# Patient Record
Sex: Female | Born: 1950
Health system: Southern US, Community
[De-identification: ages and names within clinical notes are randomized; demographics above are authoritative.]

## PROBLEM LIST (undated history)

## (undated) DIAGNOSIS — E119 Type 2 diabetes mellitus without complications: Secondary | ICD-10-CM

## (undated) DIAGNOSIS — I10 Essential (primary) hypertension: Secondary | ICD-10-CM

## (undated) HISTORY — PX: COLONOSCOPY: SHX174

---

## 1970-01-20 HISTORY — PX: CHOLECYSTECTOMY: SHX55

## 2011-01-21 HISTORY — PX: OTHER SURGICAL HISTORY: SHX169

## 2015-02-08 ENCOUNTER — Encounter (HOSPITAL_BASED_OUTPATIENT_CLINIC_OR_DEPARTMENT_OTHER): Payer: Self-pay | Admitting: *Deleted

## 2015-02-08 ENCOUNTER — Emergency Department (HOSPITAL_BASED_OUTPATIENT_CLINIC_OR_DEPARTMENT_OTHER)
Admission: EM | Admit: 2015-02-08 | Discharge: 2015-02-08 | Disposition: A | Discharge status: Home / Self Care | Payer: Worker's Compensation | Attending: Emergency Medicine | Admitting: Emergency Medicine

## 2015-02-08 ENCOUNTER — Emergency Department (HOSPITAL_BASED_OUTPATIENT_CLINIC_OR_DEPARTMENT_OTHER): Payer: Worker's Compensation

## 2015-02-08 DIAGNOSIS — S42251A Displaced fracture of greater tuberosity of right humerus, initial encounter for closed fracture: Secondary | ICD-10-CM | POA: Insufficient documentation

## 2015-02-08 DIAGNOSIS — W01198A Fall on same level from slipping, tripping and stumbling with subsequent striking against other object, initial encounter: Secondary | ICD-10-CM | POA: Insufficient documentation

## 2015-02-08 DIAGNOSIS — Y9389 Activity, other specified: Secondary | ICD-10-CM | POA: Insufficient documentation

## 2015-02-08 DIAGNOSIS — I10 Essential (primary) hypertension: Secondary | ICD-10-CM | POA: Diagnosis not present

## 2015-02-08 DIAGNOSIS — Z79899 Other long term (current) drug therapy: Secondary | ICD-10-CM | POA: Diagnosis not present

## 2015-02-08 DIAGNOSIS — Y998 Other external cause status: Secondary | ICD-10-CM | POA: Insufficient documentation

## 2015-02-08 DIAGNOSIS — S42309A Unspecified fracture of shaft of humerus, unspecified arm, initial encounter for closed fracture: Secondary | ICD-10-CM

## 2015-02-08 DIAGNOSIS — S42301A Unspecified fracture of shaft of humerus, right arm, initial encounter for closed fracture: Secondary | ICD-10-CM

## 2015-02-08 DIAGNOSIS — S4991XA Unspecified injury of right shoulder and upper arm, initial encounter: Secondary | ICD-10-CM | POA: Diagnosis present

## 2015-02-08 DIAGNOSIS — Y9289 Other specified places as the place of occurrence of the external cause: Secondary | ICD-10-CM | POA: Diagnosis not present

## 2015-02-08 DIAGNOSIS — E119 Type 2 diabetes mellitus without complications: Secondary | ICD-10-CM | POA: Insufficient documentation

## 2015-02-08 HISTORY — DX: Type 2 diabetes mellitus without complications: E11.9

## 2015-02-08 HISTORY — DX: Essential (primary) hypertension: I10

## 2015-02-08 MED ORDER — HYDROMORPHONE HCL 1 MG/ML IJ SOLN
1.0000 mg | Freq: Once | INTRAMUSCULAR | Status: AC
  Administered 2015-02-08: 1 mg via INTRAMUSCULAR
  Filled 2015-02-08: qty 1

## 2015-02-08 MED ORDER — OXYCODONE-ACETAMINOPHEN 5-325 MG PO TABS
1.0000 | ORAL_TABLET | Freq: Once | ORAL | Status: AC
  Administered 2015-02-08: 1 via ORAL
  Filled 2015-02-08: qty 1

## 2015-02-08 MED ORDER — OXYCODONE-ACETAMINOPHEN 5-325 MG PO TABS: 1.0000 | ORAL_TABLET | Freq: Four times a day (QID) | ORAL | Status: DC | PRN

## 2015-02-08 MED FILL — OXYCODONE/APAP 5-325: 5-325 | 7 days supply | Qty: 60 | Fill #0

## 2015-02-08 NOTE — ED Provider Notes (Signed)
CSN: [id number]     Arrival date & time 02/08/15  1018 History   First MD Initiated Contact with Patient 02/08/15 1036     Chief Complaint  Patient presents with  . Fall     (Consider location/radiation/quality/duration/timing/severity/associated sxs/prior Treatment) Patient is a 65 y.o. female presenting with fall.  Fall This is a new problem. The current episode started less than 1 hour ago. The problem has not changed since onset.Pertinent negatives include no chest pain. Nothing aggravates the symptoms. Nothing relieves the symptoms. She has tried nothing for the symptoms.    Past Medical History  Diagnosis Date  . Diabetes mellitus without complication (HCC)   . Hypertension    History reviewed. No pertinent past surgical history. History reviewed. No pertinent family history. Social History  Substance Use Topics  . Smoking status: Never Smoker   . Smokeless tobacco: None  . Alcohol Use: None   OB History    No data available     Review of Systems  Constitutional: Negative for chills and fatigue.  Respiratory: Negative for cough.   Cardiovascular: Negative for chest pain.  Musculoskeletal:       Pain in right anterior shoulder  All other systems reviewed and are negative.     Allergies  Review of patient's allergies indicates no known allergies.  Home Medications   Prior to Admission medications   Medication Sig Start Date End Date Taking? Authorizing Provider  candesartan-hydrochlorothiazide (ATACAND HCT) 32-12.5 MG tablet Take 1 tablet by mouth daily.   Yes Historical Provider, MD  esomeprazole (NEXIUM) 20 MG capsule Take 20 mg by mouth daily at 12 noon.   Yes Historical Provider, MD  pioglitazone (ACTOS) 45 MG tablet Take 45 mg by mouth daily.   Yes Historical Provider, MD  oxyCODONE-acetaminophen (PERCOCET/ROXICET) 5-325 MG tablet Take 1-2 tablets by mouth every 6 (six) hours as needed for severe pain. 02/08/15   Barbara Cower Kaylem Gidney, MD   BP 122/59 mmHg  Pulse  80  Temp(Src) 98.4 F (36.9 C) (Oral)  Resp 16  Ht  (1.727 m)  Wt 235 lb (106.595 kg)  BMI 35.74 kg/m2  SpO2 98% Physical Exam  Constitutional: She is oriented to person, place, and time. She appears well-developed and well-nourished.  HENT:  Head: Normocephalic and atraumatic.  Neck: Normal range of motion.  Cardiovascular: Normal rate and regular rhythm.   Pulmonary/Chest: No stridor. No respiratory distress.  Abdominal: Soft. She exhibits no distension. There is no tenderness.  Musculoskeletal: She exhibits tenderness (palpation of her anterior proximal humerus.).  Neurological: She is alert and oriented to person, place, and time.  Skin: Skin is warm and dry. No rash noted. No erythema.  Nursing note and vitals reviewed.   ED Course  Procedures (including critical care time) Labs Review Labs Reviewed - No data to display  Imaging Review Dg Shoulder Right  02/08/2015  CLINICAL DATA:  Fall on the floor at work this morning, right shoulder pain EXAM: RIGHT SHOULDER - 2+ VIEW COMPARISON:  None. FINDINGS: Two views of the right shoulder submitted. There is comminuted mild displaced fracture of the right humeral neck. Mild lateral displacement of greater humeral tuberosity fragment. IMPRESSION: Comminuted mild displaced fracture of the right proximal humerus. Electronically Signed   By: Natasha Mead M.D.   On: 02/08/2015 11:05   Ct Shoulder Right Wo Contrast  02/08/2015  CLINICAL DATA:  Shoulder pain after falling at work today. Proximal humeral fracture. EXAM: CT OF THE RIGHT SHOULDER WITHOUT CONTRAST TECHNIQUE:  Multidetector CT imaging was performed according to the standard protocol. Multiplanar CT image reconstructions were also generated. COMPARISON:  Radiographs 02/08/2015. FINDINGS: Comminuted fracture of the right humeral neck has 3 main components. The fracture involves the greater tuberosity which is mildly displaced. There is no involvement of the humeral head articular  surface. The humeral head is located. There is no evidence of glenoid or other scapular fracture. The acromioclavicular joint appears normal. There is some soft tissue swelling around the shoulder with a small shoulder joint effusion. The rotator cuff musculature appears normal. There is no significant narrowing of the subacromial space. IMPRESSION: 1. Comminuted fracture of the right humeral neck with displacement of the greater tuberosity. 2. No involvement of the humeral head articular surface, dislocation or glenoid fracture. Electronically Signed   By: Carey Bullocks M.D.   On: 02/08/2015 12:34   I have personally reviewed and evaluated these images and lab results as part of my medical decision-making.   EKG Interpretation None      MDM   Final diagnoses:  Humerus fracture   Mechanical fall onto her knees and outstretched hands. Subsequently has a right shoulder pain with difficulty moving his secondary to pain. Has tenderness near her before meals joint. We'll start with x-rays and by mouth pain medication. Found to have comminuted right humeral neck fracture. D/W Dr. Lequita Halt with orthopedics who suggested ct scan for surgical planning and close follow up in the office with a sling immobilizer. Pain controlled, NVI at time of discharge. Return precautions given.     Marily Memos, MD 02/08/15 [id number]

## 2015-02-08 NOTE — ED Notes (Signed)
MD at bedside. 

## 2015-02-08 NOTE — ED Notes (Signed)
Pt to room 9 in w/c, reports trip and fall hitting her right shoulder on the floor. Denies head injury or loc, states she also hit both knees on the floor. Dr. Clayborne Dana at bedside for exam.

## 2015-02-09 ENCOUNTER — Encounter (HOSPITAL_COMMUNITY): Payer: Self-pay | Admitting: *Deleted

## 2015-02-09 ENCOUNTER — Other Ambulatory Visit (HOSPITAL_COMMUNITY): Payer: Self-pay | Admitting: Orthopaedic Surgery

## 2015-02-11 MED ORDER — CEFAZOLIN SODIUM-DEXTROSE 2-3 GM-% IV SOLR: 2.0000 g | INTRAVENOUS | Status: DC

## 2015-02-11 MED ORDER — CHLORHEXIDINE GLUCONATE 4 % EX LIQD: 60.0000 mL | Freq: Once | CUTANEOUS | Status: DC

## 2015-02-12 ENCOUNTER — Encounter (HOSPITAL_COMMUNITY): Payer: Self-pay | Admitting: *Deleted

## 2015-02-12 ENCOUNTER — Inpatient Hospital Stay (HOSPITAL_COMMUNITY): Payer: Worker's Compensation | Admitting: Anesthesiology

## 2015-02-12 ENCOUNTER — Inpatient Hospital Stay (HOSPITAL_COMMUNITY): Payer: Worker's Compensation

## 2015-02-12 ENCOUNTER — Observation Stay (HOSPITAL_COMMUNITY)
Admission: RE | Admit: 2015-02-12 | Discharge: 2015-02-13 | Disposition: A | Discharge status: Home / Self Care | Payer: Worker's Compensation | Source: Ambulatory Visit | Attending: Orthopaedic Surgery | Admitting: Orthopaedic Surgery

## 2015-02-12 ENCOUNTER — Encounter (HOSPITAL_COMMUNITY)
Admission: RE | Disposition: A | Discharge status: Home / Self Care | Payer: Self-pay | Source: Ambulatory Visit | Attending: Orthopaedic Surgery

## 2015-02-12 DIAGNOSIS — S42209A Unspecified fracture of upper end of unspecified humerus, initial encounter for closed fracture: Secondary | ICD-10-CM | POA: Diagnosis present

## 2015-02-12 DIAGNOSIS — I10 Essential (primary) hypertension: Secondary | ICD-10-CM | POA: Diagnosis not present

## 2015-02-12 DIAGNOSIS — W19XXXA Unspecified fall, initial encounter: Secondary | ICD-10-CM | POA: Diagnosis not present

## 2015-02-12 DIAGNOSIS — Z419 Encounter for procedure for purposes other than remedying health state, unspecified: Secondary | ICD-10-CM

## 2015-02-12 DIAGNOSIS — S42201A Unspecified fracture of upper end of right humerus, initial encounter for closed fracture: Secondary | ICD-10-CM | POA: Diagnosis present

## 2015-02-12 DIAGNOSIS — Z7984 Long term (current) use of oral hypoglycemic drugs: Secondary | ICD-10-CM | POA: Insufficient documentation

## 2015-02-12 DIAGNOSIS — E119 Type 2 diabetes mellitus without complications: Secondary | ICD-10-CM | POA: Diagnosis not present

## 2015-02-12 HISTORY — PX: ORIF HUMERUS FRACTURE: SHX2126

## 2015-02-12 LAB — PROTIME-INR
INR: 1.18 (ref 0.00–1.49)
Prothrombin Time: 15.1 seconds (ref 11.6–15.2)

## 2015-02-12 LAB — CBC
HCT: 31.2 % — ABNORMAL LOW (ref 36.0–46.0)
Hemoglobin: 9.9 g/dL — ABNORMAL LOW (ref 12.0–15.0)
MCH: 27 pg (ref 26.0–34.0)
MCHC: 31.7 g/dL (ref 30.0–36.0)
MCV: 85.2 fL (ref 78.0–100.0)
Platelets: 312 10*3/uL (ref 150–400)
RBC: 3.66 MIL/uL — ABNORMAL LOW (ref 3.87–5.11)
RDW: 16.1 % — AB (ref 11.5–15.5)
WBC: 8.2 10*3/uL (ref 4.0–10.5)

## 2015-02-12 LAB — COMPREHENSIVE METABOLIC PANEL
ALBUMIN: 3.4 g/dL — AB (ref 3.5–5.0)
ALT: 11 U/L — ABNORMAL LOW (ref 14–54)
ANION GAP: 10 (ref 5–15)
AST: 19 U/L (ref 15–41)
Alkaline Phosphatase: 101 U/L (ref 38–126)
BUN: 17 mg/dL (ref 6–20)
CO2: 25 mmol/L (ref 22–32)
Calcium: 9.4 mg/dL (ref 8.9–10.3)
Chloride: 101 mmol/L (ref 101–111)
Creatinine, Ser: 0.79 mg/dL (ref 0.44–1.00)
GFR calc non Af Amer: >60 mL/min (ref >60)
GLUCOSE: 133 mg/dL — AB (ref 65–99)
POTASSIUM: 4.7 mmol/L (ref 3.5–5.1)
SODIUM: 136 mmol/L (ref 135–145)
TOTAL PROTEIN: 6.8 g/dL (ref 6.5–8.1)
Total Bilirubin: 1.1 mg/dL (ref 0.3–1.2)

## 2015-02-12 LAB — GLUCOSE, CAPILLARY
GLUCOSE-CAPILLARY: 115 mg/dL — AB (ref 65–99)
GLUCOSE-CAPILLARY: 110 mg/dL — AB (ref 65–99)

## 2015-02-12 SURGERY — OPEN REDUCTION INTERNAL FIXATION (ORIF) PROXIMAL HUMERUS FRACTURE
Anesthesia: General | Laterality: Right

## 2015-02-12 MED ORDER — ROCURONIUM BROMIDE 100 MG/10ML IV SOLN
INTRAVENOUS | Status: DC | PRN
  Administered 2015-02-12: 40 mg via INTRAVENOUS
  Administered 2015-02-12: 10 mg via INTRAVENOUS

## 2015-02-12 MED ORDER — MIDAZOLAM HCL 2 MG/2ML IJ SOLN
INTRAMUSCULAR | Status: AC
  Filled 2015-02-12: qty 2

## 2015-02-12 MED ORDER — METHOCARBAMOL 500 MG PO TABS
ORAL_TABLET | ORAL | Status: AC
  Administered 2015-02-12: 500 mg via ORAL
  Filled 2015-02-12: qty 1

## 2015-02-12 MED ORDER — METHOCARBAMOL 1000 MG/10ML IJ SOLN
500.0000 mg | Freq: Four times a day (QID) | INTRAVENOUS | Status: DC | PRN
  Filled 2015-02-12: qty 5

## 2015-02-12 MED ORDER — PROPOFOL 10 MG/ML IV BOLUS
INTRAVENOUS | Status: DC | PRN
Start: 2015-02-12 — End: 2015-02-12
  Administered 2015-02-12: 150 mg via INTRAVENOUS

## 2015-02-12 MED ORDER — OXYCODONE HCL 5 MG PO TABS
ORAL_TABLET | ORAL | Status: AC
  Administered 2015-02-12: 10 mg via ORAL
  Filled 2015-02-12: qty 2

## 2015-02-12 MED ORDER — PANTOPRAZOLE SODIUM 40 MG PO TBEC
40.0000 mg | DELAYED_RELEASE_TABLET | Freq: Every day | ORAL | Status: DC
  Administered 2015-02-13: 40 mg via ORAL
  Filled 2015-02-12: qty 1

## 2015-02-12 MED ORDER — ACETAMINOPHEN 325 MG PO TABS: 650.0000 mg | ORAL_TABLET | Freq: Four times a day (QID) | ORAL | Status: DC | PRN

## 2015-02-12 MED ORDER — METHOCARBAMOL 500 MG PO TABS: 500.0000 mg | ORAL_TABLET | Freq: Four times a day (QID) | ORAL | Status: AC | PRN

## 2015-02-12 MED ORDER — FENTANYL CITRATE (PF) 100 MCG/2ML IJ SOLN: 25.0000 ug | INTRAMUSCULAR | Status: DC | PRN

## 2015-02-12 MED ORDER — GLYCOPYRROLATE 0.2 MG/ML IJ SOLN
INTRAMUSCULAR | Status: DC | PRN
  Administered 2015-02-12: 0.2 mg via INTRAVENOUS

## 2015-02-12 MED ORDER — EPHEDRINE SULFATE 50 MG/ML IJ SOLN
INTRAMUSCULAR | Status: DC | PRN
  Administered 2015-02-12: 5 mg via INTRAVENOUS

## 2015-02-12 MED ORDER — FENTANYL CITRATE (PF) 100 MCG/2ML IJ SOLN
INTRAMUSCULAR | Status: DC | PRN
  Administered 2015-02-12: 50 ug via INTRAVENOUS
  Administered 2015-02-12: 100 ug via INTRAVENOUS

## 2015-02-12 MED ORDER — METHOCARBAMOL 500 MG PO TABS
500.0000 mg | ORAL_TABLET | Freq: Four times a day (QID) | ORAL | Status: DC | PRN
  Administered 2015-02-12 – 2015-02-13 (×2): 500 mg via ORAL
  Filled 2015-02-12: qty 1

## 2015-02-12 MED ORDER — HYDROCHLOROTHIAZIDE 12.5 MG PO CAPS
12.5000 mg | ORAL_CAPSULE | Freq: Every day | ORAL | Status: DC
  Administered 2015-02-13: 12.5 mg via ORAL
  Filled 2015-02-12: qty 1

## 2015-02-12 MED ORDER — ONDANSETRON HCL 4 MG/2ML IJ SOLN: 4.0000 mg | Freq: Once | INTRAMUSCULAR | Status: DC | PRN

## 2015-02-12 MED ORDER — CEFAZOLIN SODIUM-DEXTROSE 2-3 GM-% IV SOLR
INTRAVENOUS | Status: AC
  Administered 2015-02-12: 2 g via INTRAVENOUS
  Filled 2015-02-12: qty 50

## 2015-02-12 MED ORDER — OXYCODONE HCL 5 MG PO TABS
5.0000 mg | ORAL_TABLET | ORAL | Status: DC | PRN
  Administered 2015-02-12 – 2015-02-13 (×5): 10 mg via ORAL
  Filled 2015-02-12 (×4): qty 2

## 2015-02-12 MED ORDER — CEFAZOLIN SODIUM 1-5 GM-% IV SOLN
1.0000 g | Freq: Three times a day (TID) | INTRAVENOUS | Status: AC
  Administered 2015-02-12 – 2015-02-13 (×2): 1 g via INTRAVENOUS
  Filled 2015-02-12 (×2): qty 50

## 2015-02-12 MED ORDER — ONDANSETRON HCL 4 MG PO TABS: 4.0000 mg | ORAL_TABLET | Freq: Four times a day (QID) | ORAL | Status: DC | PRN

## 2015-02-12 MED ORDER — LIDOCAINE HCL (CARDIAC) 20 MG/ML IV SOLN
INTRAVENOUS | Status: DC | PRN
  Administered 2015-02-12: 40 mg via INTRAVENOUS

## 2015-02-12 MED ORDER — METOCLOPRAMIDE HCL 5 MG PO TABS: 5.0000 mg | ORAL_TABLET | Freq: Three times a day (TID) | ORAL | Status: DC | PRN

## 2015-02-12 MED ORDER — OXYCODONE-ACETAMINOPHEN 5-325 MG PO TABS: 1.0000 | ORAL_TABLET | Freq: Four times a day (QID) | ORAL | Status: AC | PRN

## 2015-02-12 MED ORDER — MIDAZOLAM HCL 2 MG/2ML IJ SOLN
INTRAMUSCULAR | Status: AC
  Administered 2015-02-12: 2 mg
  Filled 2015-02-12: qty 2

## 2015-02-12 MED ORDER — METOCLOPRAMIDE HCL 5 MG/ML IJ SOLN: 5.0000 mg | Freq: Three times a day (TID) | INTRAMUSCULAR | Status: DC | PRN

## 2015-02-12 MED ORDER — FENTANYL CITRATE (PF) 100 MCG/2ML IJ SOLN
50.0000 ug | Freq: Once | INTRAMUSCULAR | Status: AC
  Administered 2015-02-12: 50 ug via INTRAVENOUS

## 2015-02-12 MED ORDER — POTASSIUM CHLORIDE IN NACL 20-0.45 MEQ/L-% IV SOLN
INTRAVENOUS | Status: DC
  Administered 2015-02-13: 01:00:00 via INTRAVENOUS
  Filled 2015-02-12 (×2): qty 1000

## 2015-02-12 MED ORDER — ONDANSETRON HCL 4 MG/2ML IJ SOLN
INTRAMUSCULAR | Status: DC | PRN
  Administered 2015-02-12: 4 mg via INTRAVENOUS

## 2015-02-12 MED ORDER — MIDAZOLAM HCL 5 MG/ML IJ SOLN: 2.0000 mg | Freq: Once | INTRAMUSCULAR | Status: DC

## 2015-02-12 MED ORDER — SALINE SPRAY 0.65 % NA SOLN: 1.0000 | NASAL | Status: DC | PRN

## 2015-02-12 MED ORDER — FENTANYL CITRATE (PF) 250 MCG/5ML IJ SOLN
INTRAMUSCULAR | Status: AC
  Filled 2015-02-12: qty 5

## 2015-02-12 MED ORDER — CANDESARTAN CILEXETIL-HCTZ 32-12.5 MG PO TABS: 1.0000 | ORAL_TABLET | Freq: Every day | ORAL | Status: DC

## 2015-02-12 MED ORDER — PROPOFOL 10 MG/ML IV BOLUS
INTRAVENOUS | Status: AC
  Filled 2015-02-12: qty 20

## 2015-02-12 MED ORDER — LACTATED RINGERS IV SOLN
INTRAVENOUS | Status: DC
  Administered 2015-02-12 (×4): via INTRAVENOUS

## 2015-02-12 MED ORDER — SUGAMMADEX SODIUM 200 MG/2ML IV SOLN
INTRAVENOUS | Status: DC | PRN
  Administered 2015-02-12: 210 mg via INTRAVENOUS

## 2015-02-12 MED ORDER — MENTHOL 3 MG MT LOZG: 1.0000 | LOZENGE | OROMUCOSAL | Status: DC | PRN

## 2015-02-12 MED ORDER — PHENOL 1.4 % MT LIQD
1.0000 | OROMUCOSAL | Status: DC | PRN
Start: 2015-02-12 — End: 2015-02-13

## 2015-02-12 MED ORDER — ACETAMINOPHEN 650 MG RE SUPP: 650.0000 mg | Freq: Four times a day (QID) | RECTAL | Status: DC | PRN

## 2015-02-12 MED ORDER — HYDROMORPHONE HCL 1 MG/ML IJ SOLN: 0.5000 mg | INTRAMUSCULAR | Status: DC | PRN

## 2015-02-12 MED ORDER — SUGAMMADEX SODIUM 500 MG/5ML IV SOLN
INTRAVENOUS | Status: AC
  Filled 2015-02-12: qty 5

## 2015-02-12 MED ORDER — IRBESARTAN 300 MG PO TABS
300.0000 mg | ORAL_TABLET | Freq: Every day | ORAL | Status: DC
  Administered 2015-02-13: 300 mg via ORAL
  Filled 2015-02-12: qty 1

## 2015-02-12 MED ORDER — SODIUM CHLORIDE 0.9 % IR SOLN
Status: DC | PRN
  Administered 2015-02-12: 1000 mL

## 2015-02-12 MED ORDER — PHENYLEPHRINE HCL 10 MG/ML IJ SOLN
INTRAMUSCULAR | Status: DC | PRN
  Administered 2015-02-12: 40 ug via INTRAVENOUS

## 2015-02-12 MED ORDER — POLYETHYLENE GLYCOL 3350 17 G PO PACK: 17.0000 g | PACK | Freq: Every day | ORAL | Status: DC | PRN

## 2015-02-12 MED ORDER — PIOGLITAZONE HCL 45 MG PO TABS
45.0000 mg | ORAL_TABLET | Freq: Every day | ORAL | Status: DC
  Administered 2015-02-13: 45 mg via ORAL
  Filled 2015-02-12 (×3): qty 1

## 2015-02-12 MED ORDER — ONDANSETRON HCL 4 MG/2ML IJ SOLN: 4.0000 mg | Freq: Four times a day (QID) | INTRAMUSCULAR | Status: DC | PRN

## 2015-02-12 MED ORDER — PHENYLEPHRINE HCL 10 MG/ML IJ SOLN
10.0000 mg | INTRAVENOUS | Status: DC | PRN
  Administered 2015-02-12: 15 ug/min via INTRAVENOUS

## 2015-02-12 MED ORDER — DOCUSATE SODIUM 100 MG PO CAPS
100.0000 mg | ORAL_CAPSULE | Freq: Two times a day (BID) | ORAL | Status: DC
Start: 2015-02-13 — End: 2015-02-13
  Administered 2015-02-13: 100 mg via ORAL
  Filled 2015-02-12: qty 1

## 2015-02-12 MED ORDER — FENTANYL CITRATE (PF) 100 MCG/2ML IJ SOLN
INTRAMUSCULAR | Status: AC
  Administered 2015-02-12: 50 ug via INTRAVENOUS
  Filled 2015-02-12: qty 2

## 2015-02-12 SURGICAL SUPPLY — 59 items
BENZOIN TINCTURE PRP APPL 2/3 (GAUZE/BANDAGES/DRESSINGS) IMPLANT
BIT DRILL 3.2 (BIT) ×2
BIT DRILL 3.2XCALB NS DISP (BIT) ×1 IMPLANT
BIT DRILL CALIBRATED 2.7 (BIT) ×2 IMPLANT
BIT DRILL CALIBRATED 2.7MM (BIT) ×1
BIT DRL 3.2XCALB NS DISP (BIT) ×1
CLOSURE WOUND 1/2 X4 (GAUZE/BANDAGES/DRESSINGS) ×2
COVER SURGICAL LIGHT HANDLE (MISCELLANEOUS) ×3 IMPLANT
DRAPE C-ARM 42X72 X-RAY (DRAPES) ×3 IMPLANT
DRAPE IMP U-DRAPE 54X76 (DRAPES) ×3 IMPLANT
DRAPE INCISE IOBAN 66X45 STRL (DRAPES) ×3 IMPLANT
DRAPE SURG 17X23 STRL (DRAPES) ×3 IMPLANT
DRAPE U-SHAPE 47X51 STRL (DRAPES) ×3 IMPLANT
DRSG EMULSION OIL 3X3 NADH (GAUZE/BANDAGES/DRESSINGS) ×3 IMPLANT
DRSG MEPILEX BORDER 4X12 (GAUZE/BANDAGES/DRESSINGS) ×3 IMPLANT
ELECT REM PT RETURN 9FT ADLT (ELECTROSURGICAL) ×3
ELECTRODE REM PT RTRN 9FT ADLT (ELECTROSURGICAL) ×1 IMPLANT
GAUZE SPONGE 4X4 12PLY STRL (GAUZE/BANDAGES/DRESSINGS) ×3 IMPLANT
GLOVE BIOGEL PI IND STRL 8 (GLOVE) ×2 IMPLANT
GLOVE BIOGEL PI INDICATOR 8 (GLOVE) ×4
GLOVE ORTHO TXT STRL SZ7.5 (GLOVE) ×6 IMPLANT
GOWN STRL REUS W/ TWL LRG LVL3 (GOWN DISPOSABLE) ×1 IMPLANT
GOWN STRL REUS W/ TWL XL LVL3 (GOWN DISPOSABLE) ×1 IMPLANT
GOWN STRL REUS W/TWL 2XL LVL3 (GOWN DISPOSABLE) ×3 IMPLANT
GOWN STRL REUS W/TWL LRG LVL3 (GOWN DISPOSABLE) ×2
GOWN STRL REUS W/TWL XL LVL3 (GOWN DISPOSABLE) ×2
K-WIRE 2X5 SS THRDED S3 (WIRE) ×6
KIT BASIN OR (CUSTOM PROCEDURE TRAY) ×3 IMPLANT
KIT ROOM TURNOVER OR (KITS) ×3 IMPLANT
KWIRE 2X5 SS THRDED S3 (WIRE) ×2 IMPLANT
MANIFOLD NEPTUNE II (INSTRUMENTS) ×3 IMPLANT
NEEDLE HYPO 25GX1X1/2 BEV (NEEDLE) IMPLANT
NS IRRIG 1000ML POUR BTL (IV SOLUTION) ×3 IMPLANT
PACK SHOULDER (CUSTOM PROCEDURE TRAY) ×3 IMPLANT
PACK UNIVERSAL I (CUSTOM PROCEDURE TRAY) ×3 IMPLANT
PAD ARMBOARD 7.5X6 YLW CONV (MISCELLANEOUS) ×6 IMPLANT
PEG LOCKING 3.2MMX26MM (Peg) ×3 IMPLANT
PEG LOCKING 3.2MMX44 (Peg) ×3 IMPLANT
PEG LOCKING 3.2MMX46 (Peg) ×3 IMPLANT
PEG LOCKING 3.2X32 (Peg) ×3 IMPLANT
PEG LOCKING 3.2X36 (Screw) ×3 IMPLANT
PEG LOCKING 3.2X38 (Screw) ×6 IMPLANT
PEG LOCKING 3.2X42 (Screw) ×6 IMPLANT
PEG LOCKING 3.2X48 (Peg) ×3 IMPLANT
PLATE PROX HUM HI R 3H 80 (Plate) ×3 IMPLANT
SCREW LP NL T15 3.5X24 (Screw) ×9 IMPLANT
SCREW LP NL T15 3.5X26 (Screw) ×3 IMPLANT
SLEEVE MEASURING 3.2 (BIT) ×3 IMPLANT
SLING ARM IMMOBILIZER XL (CAST SUPPLIES) ×3 IMPLANT
SPONGE LAP 18X18 X RAY DECT (DISPOSABLE) ×6 IMPLANT
STRIP CLOSURE SKIN 1/2X4 (GAUZE/BANDAGES/DRESSINGS) ×4 IMPLANT
SUCTION FRAZIER TIP 10 FR DISP (SUCTIONS) ×3 IMPLANT
SUT FIBERWIRE #2 38 T-5 BLUE (SUTURE)
SUT VIC AB 2-0 CT1 27 (SUTURE) ×2
SUT VIC AB 2-0 CT1 TAPERPNT 27 (SUTURE) ×1 IMPLANT
SUT VIC AB 3-0 FS2 27 (SUTURE) ×3 IMPLANT
SUTURE FIBERWR #2 38 T-5 BLUE (SUTURE) IMPLANT
SYR CONTROL 10ML LL (SYRINGE) IMPLANT
WATER STERILE IRR 1000ML POUR (IV SOLUTION) IMPLANT

## 2015-02-12 NOTE — Brief Op Note (Signed)
02/12/2015  3:03 PM  PATIENT:  Megan Williamson  65 y.o. female  PRE-OPERATIVE DIAGNOSIS:  Right Proximal Humerus Fracture  POST-OPERATIVE DIAGNOSIS:  SAME  PROCEDURE:  Procedure(s): OPEN REDUCTION INTERNAL FIXATION (ORIF) PROXIMAL HUMERUS FRACTURE (Right)  SURGEON:  Surgeon(s) and Role:    * Eldred Manges, MD - Primary  PHYSICIAN ASSISTANT: Krysteena Stalker m. Barry Dienes     ANESTHESIA:   general  EBL:  Total I/O In: 1100 [I.V.:1100] Out: 100 [Blood:100]  BLOOD ADMINISTERED:none  DRAINS: none   LOCAL MEDICATIONS USED:  NONE  SPECIMEN:  No Specimen  DISPOSITION OF SPECIMEN:  N/A  COUNTS:  YES  TOURNIQUET:  * No tourniquets in log *  DICTATION: .Dragon Dictation  PLAN OF CARE: Admit for overnight observation  PATIENT DISPOSITION:  PACU - hemodynamically stable.

## 2015-02-12 NOTE — Anesthesia Procedure Notes (Addendum)
Anesthesia Regional Block:  Interscalene brachial plexus block  Pre-Anesthetic Checklist: ,, timeout performed, Correct Patient, Correct Site, Correct Laterality, Correct Procedure, Correct Position, site marked, Risks and benefits discussed,  Surgical consent,  Pre-op evaluation,  At surgeon's request and post-op pain management  Laterality: Right  Prep: chloraprep       Needles:  Injection technique: Single-shot  Needle Type: Echogenic Stimulator Needle      Needle Gauge: 21 and 21 G    Additional Needles:  Procedures: ultrasound guided (picture in chart) and nerve stimulator Interscalene brachial plexus block Narrative:  Injection made incrementally with aspirations every 5 mL.  Performed by: Personally  Anesthesiologist: JUDD, MARY  Additional Notes: Risks, benefits and alternative to block explained extensively.  Patient tolerated procedure well, without complications.   Procedure Name: Intubation Performed by: Dairl Ponder Pre-anesthesia Checklist: Patient identified, Emergency Drugs available, Suction available, Patient being monitored and Timeout performed Patient Re-evaluated:Patient Re-evaluated prior to inductionOxygen Delivery Method: Circle system utilized Preoxygenation: Pre-oxygenation with 100% oxygen Intubation Type: IV induction Ventilation: Mask ventilation without difficulty and Oral airway inserted - appropriate to patient size Laryngoscope Size: Mac and 3 Grade View: Grade I Tube type: Oral Tube size: 7.0 mm Number of attempts: 1 Placement Confirmation: ETT inserted through vocal cords under direct vision,  positive ETCO2,  CO2 detector and breath sounds checked- equal and bilateral Secured at: 21 cm Tube secured with: Tape

## 2015-02-12 NOTE — Transfer of Care (Signed)
Immediate Anesthesia Transfer of Care Note  Patient: Megan Williamson  Procedure(s) Performed: Procedure(s): OPEN REDUCTION INTERNAL FIXATION (ORIF) PROXIMAL HUMERUS FRACTURE (Right)  Patient Location: PACU  Anesthesia Type:General  Level of Consciousness: awake, alert  and oriented  Airway & Oxygen Therapy: Patient Spontanous Breathing and Patient connected to nasal cannula oxygen  Post-op Assessment: Report given to RN and Post -op Vital signs reviewed and stable  Post vital signs: Reviewed and stable  Last Vitals:  Filed Vitals:   02/12/15 1150 02/12/15 1155  BP: 127/44 98/85  Pulse: 83 88  Temp:    Resp: 21 19    Complications: No apparent anesthesia complications

## 2015-02-12 NOTE — Anesthesia Preprocedure Evaluation (Addendum)
Anesthesia Evaluation  Patient identified by MRN, date of birth, ID band Patient awake    Reviewed: Allergy & Precautions, NPO status , Patient's Chart, lab work & pertinent test results  History of Anesthesia Complications Negative for: history of anesthetic complications  Airway Mallampati: II  TM Distance: >3 FB Neck ROM: Full    Dental no notable dental hx. (+) Dental Advisory Given   Pulmonary neg pulmonary ROS,    Pulmonary exam normal breath sounds clear to auscultation       Cardiovascular hypertension, Pt. on medications Normal cardiovascular exam Rhythm:Regular Rate:Normal     Neuro/Psych negative neurological ROS  negative psych ROS   GI/Hepatic negative GI ROS, Neg liver ROS,   Endo/Other  diabetes, Oral Hypoglycemic Agents  Renal/GU negative Renal ROS  negative genitourinary   Musculoskeletal negative musculoskeletal ROS (+)   Abdominal   Peds negative pediatric ROS (+)  Hematology negative hematology ROS (+)   Anesthesia Other Findings   Reproductive/Obstetrics negative OB ROS                            Anesthesia Physical Anesthesia Plan  ASA: II  Anesthesia Plan: General   Post-op Pain Management: GA combined w/ Regional for post-op pain   Induction: Intravenous  Airway Management Planned: Oral ETT  Additional Equipment:   Intra-op Plan:   Post-operative Plan: Extubation in OR  Informed Consent: I have reviewed the patients History and Physical, chart, labs and discussed the procedure including the risks, benefits and alternatives for the proposed anesthesia with the patient or authorized representative who has indicated his/her understanding and acceptance.   Dental advisory given  Plan Discussed with: CRNA  Anesthesia Plan Comments:         Anesthesia Quick Evaluation

## 2015-02-12 NOTE — Interval H&P Note (Signed)
History and Physical Interval Note:  02/12/2015 12:29 PM  Megan Williamson  has presented today for surgery, with the diagnosis of Right Proximal Humerus Fracture  The various methods of treatment have been discussed with the patient and family. After consideration of risks, benefits and other options for treatment, the patient has consented to  Procedure(s): OPEN REDUCTION INTERNAL FIXATION (ORIF) PROXIMAL HUMERUS FRACTURE (Right) as a surgical intervention .  The patient's history has been reviewed, patient examined, no change in status, stable for surgery.  I have reviewed the patient's chart and labs.  Questions were answered to the patient's satisfaction.     YATES,MARK C

## 2015-02-12 NOTE — Op Note (Signed)
Megan Williamson, DAPONTE NO.:  [id number]  MEDICAL RECORD NO.:  [id number]  LOCATION:  [id number]                        FACILITY:  [facility]  PHYSICIAN:  Daymian Lill C. Ophelia Charter, M.D.    DATE OF BIRTH:  [birth year]  DATE OF PROCEDURE:  02/12/2015 DATE OF DISCHARGE:                              OPERATIVE REPORT   PREOPERATIVE DIAGNOSIS:  Comminuted right proximal humerus fracture.  POSTOPERATIVE DIAGNOSIS:  Comminuted right proximal humerus fracture.  PROCEDURE:  Open reduction and internal fixation of right proximal humerus.  SURGEON:  Saburo Luger C. Ophelia Charter, M.D.  ASSISTANT:  Genene Churn. Barry Dienes, PA-C, medically necessary and present for the entire procedure.  ANESTHESIA:  General plus preoperative scalene block.  ESTIMATED BLOOD LOSS:  Less than 100 mL.  IMPLANTS:  Biomet anatomic proximal humerus plate with distal screws and multiple smooth pegs.  DESCRIPTION OF PROCEDURE:  After induction of general anesthesia, orotracheal intubation, standard prepping and draping, preoperative block, the patient was in the beach chair position.  Ancef was given prophylactically.  Deltopectoral incision was made.  After time-out, Betadine Steri-Drape had been applied, impervious stockinette and Coban. Cephalic vein was taken laterally with the arm.  A narrow large Hohmann retractor was placed.  Biceps tendon was identified and the greater tuberosity fragment actually extended around to the bicipital groove and large shoulder retractor was able to be hooked over the greater tuberosity and pushed it down into place.  The tall Biomet plate, short length was selected so would catch the tuberosity fragment.  It was adjusted, checked and then a K-wire was drilled.  The plate was just slightly high.  Slotted hole was drilled and then plate was slid down a few millimeters, 2 to 3 and then re-pinned, rechecked and was in excellent position.  Distal holes were filled, initial screw to suck the plate down,  still had it slightly off the bone.  Additional nonlocking screws were used to pull the plate marching from the bottom proximal. Next, proximal peg holes were drilled.  All holes were drilled, checked, depth gauge measurements and awls were tightened down securely.  The shoulder was rotated, checked on fluoroscopy, AP, lateral and then flexed and extended and rotated to make sure pegs would approach the joint, none penetrated through the articular surface.  There was good position and alignment.  All screw holes were filled with either screws or the proximal areas with the pegs.  Copious irrigation, closure of the deltopectoral lying and fall back together too on the subcutaneous tissue, subcuticular skin closure, postop dressing, and then replacement of a sling.  The patient tolerated the procedure well, transferred to the recovery room in stable condition.     Chrystina Naff C. Ophelia Charter, M.D.     MCY/MEDQ  D:  02/12/2015  T:  02/12/2015  Job:  [id number]

## 2015-02-12 NOTE — H&P (Signed)
Megan Williamson is an 65 y.o. female.   Chief complaint: right proximal humerus fracture  HPI:  Patient fell last week landing onto her right shoulder.  xrays showed a right proximal humerus fracture.  Seen in our office to discuss definitive treatment options.    Past Medical History  Diagnosis Date  . Hypertension   . Diabetes mellitus without complication (HCC)     Type II    Past Surgical History  Procedure Laterality Date  . Fracture arm Left 2013  . Cesarean section  1988  . Cholecystectomy  1972  . Colonoscopy      History reviewed. No pertinent family history. Social History:  reports that she has never smoked. She does not have any smokeless tobacco history on file. She reports that she does not drink alcohol or use illicit drugs.  Allergies: No Known Allergies  No prescriptions prior to admission    No results found for this or any previous visit (from the past 48 hour(s)). No results found.  Review of Systems  Constitutional: Negative.   HENT: Negative.   Eyes: Negative.   Respiratory: Negative.   Cardiovascular: Negative.   Gastrointestinal: Negative.   Genitourinary: Negative.   Musculoskeletal: Positive for joint pain.  Skin: Negative.   Neurological: Negative.   Psychiatric/Behavioral: Negative.     There were no vitals taken for this visit. Physical Exam  Constitutional: She is oriented to person, place, and time. No distress.  HENT:  Head: Atraumatic.  Eyes: EOM are normal.  Neck: Normal range of motion.  Cardiovascular: Normal rate.   Respiratory: Effort normal. No respiratory distress.  GI: She exhibits no distension.  Musculoskeletal: She exhibits tenderness.  Neurological: She is alert and oriented to person, place, and time.  Skin: Skin is warm and dry.  Psychiatric: She has a normal mood and affect.     Assessment/Plan Right proximal humerus fracture.  Will proceed with ORIF as scheduled.  Procedure along with possible risks and  complications discussed.  All questions answered and wishes to proceed.   Megan Williamson 02/12/2015, 7:11 AM

## 2015-02-12 NOTE — Progress Notes (Signed)
Pt has been holding in PACU since 16:15 for a 5 north bed. Spoke with [place] @ 18:30. Asked to wait until shift change to bring patient.

## 2015-02-12 NOTE — Anesthesia Postprocedure Evaluation (Signed)
Anesthesia Post Note  Patient: Megan Williamson  Procedure(s) Performed: Procedure(s) (LRB): OPEN REDUCTION INTERNAL FIXATION (ORIF) PROXIMAL HUMERUS FRACTURE (Right)  Patient location during evaluation: PACU Anesthesia Type: General Level of consciousness: awake and alert Pain management: pain level controlled Vital Signs Assessment: post-procedure vital signs reviewed and stable Respiratory status: spontaneous breathing, nonlabored ventilation, respiratory function stable and patient connected to nasal cannula oxygen Cardiovascular status: blood pressure returned to baseline and stable Postop Assessment: no signs of nausea or vomiting Anesthetic complications: no    Last Vitals:  Filed Vitals:   02/12/15 1615 02/12/15 1630  BP: 127/46   Pulse: 75 80  Temp:    Resp: 14 22    Last Pain:  Filed Vitals:   02/12/15 1637  PainSc: 0-No pain                 Felicidad Sugarman JENNETTE

## 2015-02-13 ENCOUNTER — Encounter (HOSPITAL_COMMUNITY): Payer: Self-pay | Admitting: Orthopaedic Surgery

## 2015-02-13 DIAGNOSIS — S42201A Unspecified fracture of upper end of right humerus, initial encounter for closed fracture: Secondary | ICD-10-CM | POA: Diagnosis not present

## 2015-02-13 LAB — BASIC METABOLIC PANEL
ANION GAP: 9 (ref 5–15)
BUN: 12 mg/dL (ref 6–20)
CALCIUM: 9 mg/dL (ref 8.9–10.3)
CO2: 28 mmol/L (ref 22–32)
CREATININE: 0.66 mg/dL (ref 0.44–1.00)
Chloride: 99 mmol/L — ABNORMAL LOW (ref 101–111)
GFR calc non Af Amer: >60 mL/min (ref >60)
GLUCOSE: 175 mg/dL — AB (ref 65–99)
Potassium: 4.8 mmol/L (ref 3.5–5.1)
Sodium: 136 mmol/L (ref 135–145)

## 2015-02-13 LAB — CBC
HCT: 31.1 % — ABNORMAL LOW (ref 36.0–46.0)
Hemoglobin: 9.7 g/dL — ABNORMAL LOW (ref 12.0–15.0)
MCH: 26.9 pg (ref 26.0–34.0)
MCHC: 31.2 g/dL (ref 30.0–36.0)
MCV: 86.4 fL (ref 78.0–100.0)
PLATELETS: 334 10*3/uL (ref 150–400)
RBC: 3.6 MIL/uL — ABNORMAL LOW (ref 3.87–5.11)
RDW: 16.5 % — AB (ref 11.5–15.5)
WBC: 9.8 10*3/uL (ref 4.0–10.5)

## 2015-02-13 NOTE — Progress Notes (Signed)
Subjective: 1 Day Post-Op Procedure(s) (LRB): OPEN REDUCTION INTERNAL FIXATION (ORIF) PROXIMAL HUMERUS FRACTURE (Right) Patient reports pain as moderate.  Eating breakfast. Block wore off last night.   Objective: Vital signs in last 24 hours: Temp:  [98 F (36.7 C)-98.2 F (36.8 C)] 98.2 F (36.8 C) (01/24 0430) Pulse Rate:  [62-97] 92 (01/24 0430) Resp:  [13-22] 20 (01/24 0430) BP: (98-166)/(31-85) 129/49 mmHg (01/24 0430) SpO2:  [95 %-100 %] 99 % (01/24 0430) Weight:  [105.688 kg (233 lb)] 105.688 kg (233 lb) (01/23 1000)  Intake/Output from previous day: 01/23 0701 - 01/24 0700 In: 1575 [I.V.:1575] Out: 100 [Blood:100] Intake/Output this shift:     Recent Labs  02/12/15 0952  HGB 9.9*    Recent Labs  02/12/15 0952  WBC 8.2  RBC 3.66*  HCT 31.2*  PLT 312    Recent Labs  02/12/15 0952  NA 136  K 4.7  CL 101  CO2 25  BUN 17  CREATININE 0.79  GLUCOSE 133*  CALCIUM 9.4    Recent Labs  02/12/15 0952  INR 1.18    Neurologically intact  Assessment/Plan: 1 Day Post-Op Procedure(s) (LRB): OPEN REDUCTION INTERNAL FIXATION (ORIF) PROXIMAL HUMERUS FRACTURE (Right) Discharge home. Office one week  Megan Williamson C 02/13/2015, 7:44 AM

## 2015-02-28 NOTE — Discharge Summary (Signed)
Patient ID: Megan Williamson MRN: [id number] DOB/AGE: 1950/10/02 65 y.o.  Admit date: 02/12/2015 Discharge date: 02/28/2015  Admission Diagnoses:  Active Problems:   Proximal humerus fracture   Discharge Diagnoses:  Active Problems:   Proximal humerus fracture  status post Procedure(s): OPEN REDUCTION INTERNAL FIXATION (ORIF) PROXIMAL HUMERUS FRACTURE  Past Medical History  Diagnosis Date  . Hypertension   . Diabetes mellitus without complication (HCC)     Type II    Surgeries: Procedure(s): OPEN REDUCTION INTERNAL FIXATION (ORIF) PROXIMAL HUMERUS FRACTURE on 02/12/2015   Consultants:    Discharged Condition: Improved  Hospital Course: Megan Williamson is an 65 y.o. female who was admitted 02/12/2015 for operative treatment of. Patient failed conservative treatments (please see the history and physical for the specifics) and had severe unremitting pain that affects sleep, daily activities and work/hobbies. After pre-op clearance, the patient was taken to the operating room on 02/12/2015 and underwent  Procedure(s): OPEN REDUCTION INTERNAL FIXATION (ORIF) PROXIMAL HUMERUS FRACTURE.    Patient was given perioperative antibiotics:  Anti-infectives    Start     Dose/Rate Route Frequency Ordered Stop   02/12/15 2045  ceFAZolin (ANCEF) IVPB 1 g/50 mL premix     1 g 100 mL/hr over 30 Minutes Intravenous 3 times per day 02/12/15 1729 02/13/15 0620   02/12/15 1035  ceFAZolin (ANCEF) 2-3 GM-% IVPB SOLR    Comments:  Marrianne Mood   : cabinet override      02/12/15 1035 02/12/15 1248   02/11/15 1020  ceFAZolin (ANCEF) IVPB 2 g/50 mL premix  Status:  Discontinued     2 g 100 mL/hr over 30 Minutes Intravenous On call to O.R. 02/11/15 1020 02/12/15 2244       Patient was given sequential compression devices and early ambulation to prevent DVT.   Patient benefited maximally from hospital stay and there were no complications. At the time of discharge, the patient was urinating/moving  their bowels without difficulty, tolerating a regular diet, pain is controlled with oral pain medications and they have been cleared by PT/OT.   Recent vital signs: No data found.    Recent laboratory studies: No results for input(s): WBC, HGB, HCT, PLT, NA, K, CL, CO2, BUN, CREATININE, GLUCOSE, INR, CALCIUM in the last 72 hours.  Invalid input(s): PT, 2   Discharge Medications:     Medication List    STOP taking these medications        acetaminophen 325 MG tablet  Commonly known as:  TYLENOL      TAKE these medications        candesartan-hydrochlorothiazide 32-12.5 MG tablet  Commonly known as:  ATACAND HCT  Take 1 tablet by mouth daily.     esomeprazole 20 MG capsule  Commonly known as:  NEXIUM  Take 20 mg by mouth 2 (two) times daily before a meal.     methocarbamol 500 MG tablet  Commonly known as:  ROBAXIN  Take 1 tablet (500 mg total) by mouth every 6 (six) hours as needed for muscle spasms.     methocarbamol 500 MG tablet  Commonly known as:  ROBAXIN  Take 1 tablet (500 mg total) by mouth every 6 (six) hours as needed for muscle spasms.     oxyCODONE-acetaminophen 5-325 MG tablet  Commonly known as:  PERCOCET/ROXICET  Take 1-2 tablets by mouth every 6 (six) hours as needed for severe pain.     pioglitazone 45 MG tablet  Commonly known as:  ACTOS  Take 45 mg by mouth daily.     sodium chloride 0.65 % Soln nasal spray  Commonly known as:  OCEAN  Place 1 spray into both nostrils as needed for congestion.        Diagnostic Studies: Dg Chest 1 View  02/12/2015  CLINICAL DATA:  Right shoulder fracture. Preop respiratory exam. Hypertension. EXAM: CHEST 1 VIEW COMPARISON:  None. FINDINGS: The heart size and mediastinal contours are within normal limits. Both lungs are clear. No evidence of pneumothorax or pleural effusion. Comminuted fracture of the right humeral head and neck noted. IMPRESSION: No active cardiopulmonary disease. Comminuted fracture of right  humeral head and neck. Electronically Signed   By: Myles Rosenthal M.D.   On: 02/12/2015 10:29   Dg Shoulder Right  02/08/2015  CLINICAL DATA:  Fall on the floor at work this morning, right shoulder pain EXAM: RIGHT SHOULDER - 2+ VIEW COMPARISON:  None. FINDINGS: Two views of the right shoulder submitted. There is comminuted mild displaced fracture of the right humeral neck. Mild lateral displacement of greater humeral tuberosity fragment. IMPRESSION: Comminuted mild displaced fracture of the right proximal humerus. Electronically Signed   By: Natasha Mead M.D.   On: 02/08/2015 11:05   Ct Shoulder Right Wo Contrast  02/08/2015  CLINICAL DATA:  Shoulder pain after falling at work today. Proximal humeral fracture. EXAM: CT OF THE RIGHT SHOULDER WITHOUT CONTRAST TECHNIQUE: Multidetector CT imaging was performed according to the standard protocol. Multiplanar CT image reconstructions were also generated. COMPARISON:  Radiographs 02/08/2015. FINDINGS: Comminuted fracture of the right humeral neck has 3 main components. The fracture involves the greater tuberosity which is mildly displaced. There is no involvement of the humeral head articular surface. The humeral head is located. There is no evidence of glenoid or other scapular fracture. The acromioclavicular joint appears normal. There is some soft tissue swelling around the shoulder with a small shoulder joint effusion. The rotator cuff musculature appears normal. There is no significant narrowing of the subacromial space. IMPRESSION: 1. Comminuted fracture of the right humeral neck with displacement of the greater tuberosity. 2. No involvement of the humeral head articular surface, dislocation or glenoid fracture. Electronically Signed   By: Carey Bullocks M.D.   On: 02/08/2015 12:34   Dg Humerus Right  02/12/2015  CLINICAL DATA:  Right proximal humeral fracture ORIF EXAM: DG C-ARM 61-120 MIN; RIGHT HUMERUS - 2+ VIEW COMPARISON:  None FLUOROSCOPY TIME:  32 second  FINDINGS: Right humeral head fracture transfixed with a lateral sideplate and multiple interlocking screws in near anatomic alignment. No dislocation. IMPRESSION: ORIF right proximal humeral fracture. Electronically Signed   By: Elige Ko   On: 02/12/2015 14:44   Dg C-arm 1-60 Min  02/12/2015  CLINICAL DATA:  Right proximal humeral fracture ORIF EXAM: DG C-ARM 61-120 MIN; RIGHT HUMERUS - 2+ VIEW COMPARISON:  None FLUOROSCOPY TIME:  32 second FINDINGS: Right humeral head fracture transfixed with a lateral sideplate and multiple interlocking screws in near anatomic alignment. No dislocation. IMPRESSION: ORIF right proximal humeral fracture. Electronically Signed   By: Elige Ko   On: 02/12/2015 14:44        Discharge Instructions    Call MD / Call 911    Complete by:  As directed   If you experience chest pain or shortness of breath, CALL 911 and be transported to the hospital emergency room.  If you develope a fever above 101 F, pus (white drainage) or increased drainage or redness at the wound,  or calf pain, call your surgeon's office.     Constipation Prevention    Complete by:  As directed   Drink plenty of fluids.  Prune juice may be helpful.  You may use a stool softener, such as Colace (over the counter) 100 mg twice a day.  Use MiraLax (over the counter) for constipation as needed.     Diet - low sodium heart healthy    Complete by:  As directed      Discharge instructions    Complete by:  As directed   Remove dressing 48 hours postop and apply 4x4 gauze and tape.  Shoulder immobilizer must be on at all times.  Do not move arm. No lifting. Ice off an on as needed.  No showering.     Driving restrictions    Complete by:  As directed   No driving until further notice.     Increase activity slowly as tolerated    Complete by:  As directed      Lifting restrictions    Complete by:  As directed   No lifting until further notice.           Follow-up Information    Schedule an  appointment as soon as possible for a visit with Eldred Manges, MD.   Specialty:  Orthopedic Surgery   Why:  need return office visit one week postop   Contact information:   [address] [place] [address] [place] [address] [phone]       Discharge Plan:  discharge to home Disposition:     Signed: Naida Sleight 02/28/2015, 9:58 AM

## 2017-01-20 HISTORY — PX: BREAST BIOPSY: SHX20

## 2017-07-15 ENCOUNTER — Other Ambulatory Visit: Payer: Self-pay | Admitting: Internal Medicine

## 2017-07-15 DIAGNOSIS — R921 Mammographic calcification found on diagnostic imaging of breast: Secondary | ICD-10-CM

## 2017-07-29 ENCOUNTER — Ambulatory Visit
Admission: RE | Admit: 2017-07-29 | Discharge: 2017-07-29 | Disposition: A | Discharge status: Home / Self Care | Payer: Medicare Other | Source: Ambulatory Visit | Attending: Internal Medicine | Admitting: Internal Medicine

## 2017-07-29 ENCOUNTER — Ambulatory Visit
Admission: RE | Admit: 2017-07-29 | Discharge: 2017-07-29 | Disposition: A | Discharge status: Home / Self Care | Payer: Self-pay | Source: Ambulatory Visit | Attending: Internal Medicine | Admitting: Internal Medicine

## 2017-07-29 DIAGNOSIS — R921 Mammographic calcification found on diagnostic imaging of breast: Secondary | ICD-10-CM

## 2019-09-09 ENCOUNTER — Other Ambulatory Visit: Payer: Self-pay | Admitting: Internal Medicine

## 2019-09-09 DIAGNOSIS — Z1231 Encounter for screening mammogram for malignant neoplasm of breast: Secondary | ICD-10-CM

## 2019-09-22 ENCOUNTER — Ambulatory Visit: Payer: Medicare Other

## 2019-10-04 ENCOUNTER — Ambulatory Visit
Admission: RE | Admit: 2019-10-04 | Discharge: 2019-10-04 | Disposition: A | Discharge status: Home / Self Care | Payer: Medicare Other | Source: Ambulatory Visit | Attending: Internal Medicine | Admitting: Internal Medicine

## 2019-10-04 ENCOUNTER — Other Ambulatory Visit: Payer: Self-pay

## 2019-10-04 DIAGNOSIS — Z1231 Encounter for screening mammogram for malignant neoplasm of breast: Secondary | ICD-10-CM

## 2020-12-03 ENCOUNTER — Other Ambulatory Visit: Payer: Self-pay | Admitting: Internal Medicine

## 2020-12-03 DIAGNOSIS — Z1231 Encounter for screening mammogram for malignant neoplasm of breast: Secondary | ICD-10-CM

## 2021-01-07 ENCOUNTER — Ambulatory Visit
Admission: RE | Admit: 2021-01-07 | Discharge: 2021-01-07 | Disposition: A | Discharge status: Home / Self Care | Payer: Medicare Other | Source: Ambulatory Visit | Attending: Internal Medicine | Admitting: Internal Medicine

## 2021-01-07 DIAGNOSIS — Z1231 Encounter for screening mammogram for malignant neoplasm of breast: Secondary | ICD-10-CM

## 2021-11-25 ENCOUNTER — Other Ambulatory Visit: Payer: Self-pay | Admitting: Internal Medicine

## 2021-11-25 DIAGNOSIS — Z1231 Encounter for screening mammogram for malignant neoplasm of breast: Secondary | ICD-10-CM

## 2022-01-23 DIAGNOSIS — Z1231 Encounter for screening mammogram for malignant neoplasm of breast: Secondary | ICD-10-CM

## 2022-03-17 ENCOUNTER — Ambulatory Visit
Admission: RE | Admit: 2022-03-17 | Discharge: 2022-03-17 | Disposition: A | Discharge status: Home / Self Care | Payer: Medicare HMO | Source: Ambulatory Visit | Attending: Internal Medicine | Admitting: Internal Medicine

## 2022-03-17 DIAGNOSIS — Z1231 Encounter for screening mammogram for malignant neoplasm of breast: Secondary | ICD-10-CM

## 2022-07-07 IMAGING — MG MM DIGITAL SCREENING BILAT W/ TOMO AND CAD
8 series · 8 of 24 positions shown · non-contrast
Comparison: Previous exam(s).

CLINICAL DATA: Screening.

EXAM:
DIGITAL SCREENING BILATERAL MAMMOGRAM WITH TOMOSYNTHESIS AND CAD
TECHNIQUE: Bilateral screening digital craniocaudal and mediolateral oblique
mammograms were obtained. Bilateral screening digital breast
tomosynthesis was performed. The images were evaluated with
computer-aided detection.

[R CC synth-2D]
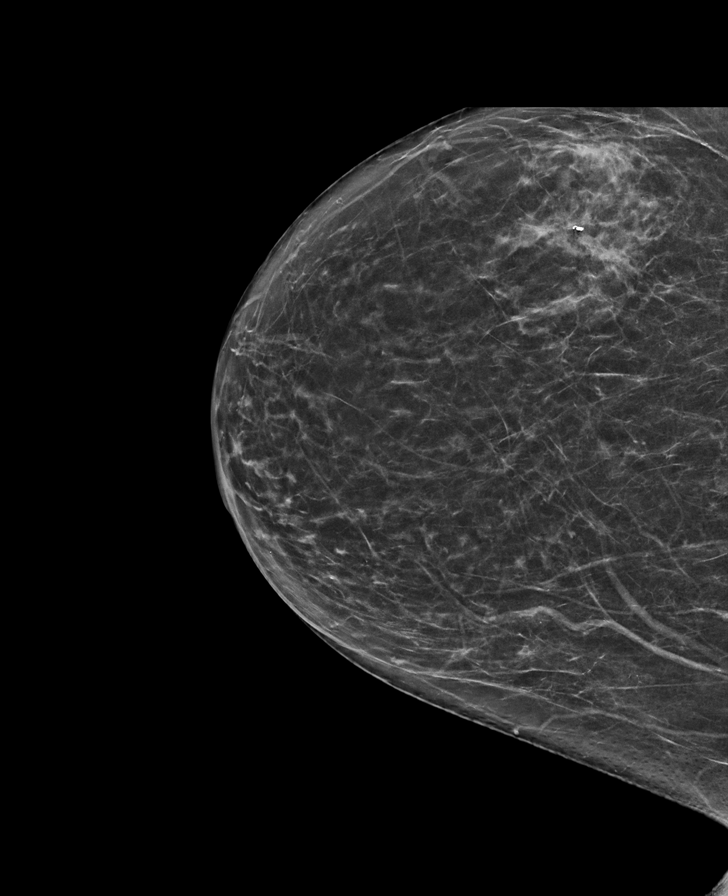

[R MLO synth-2D]
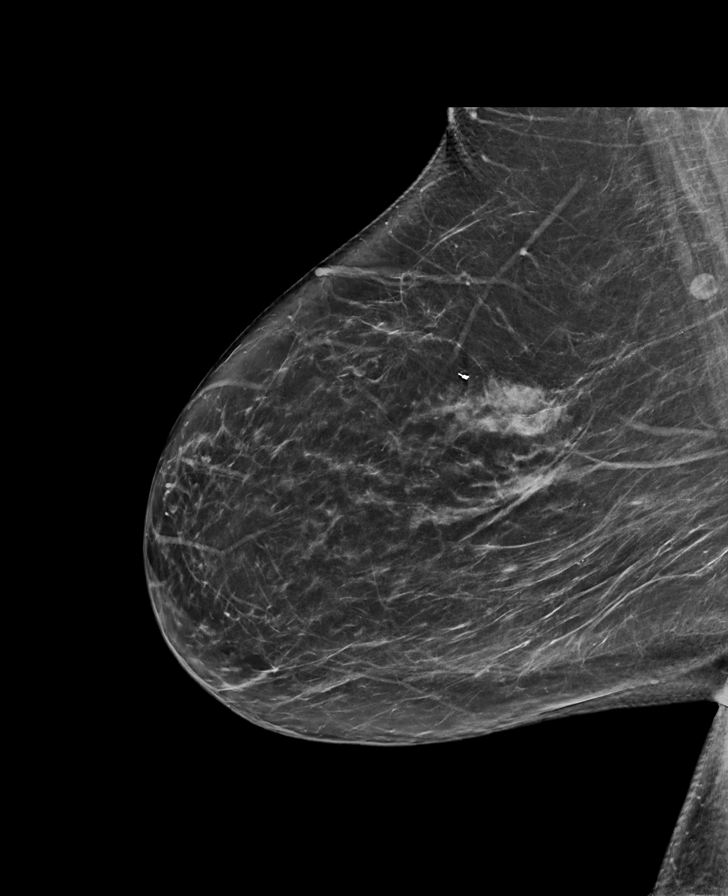

[L MLO synth-2D]
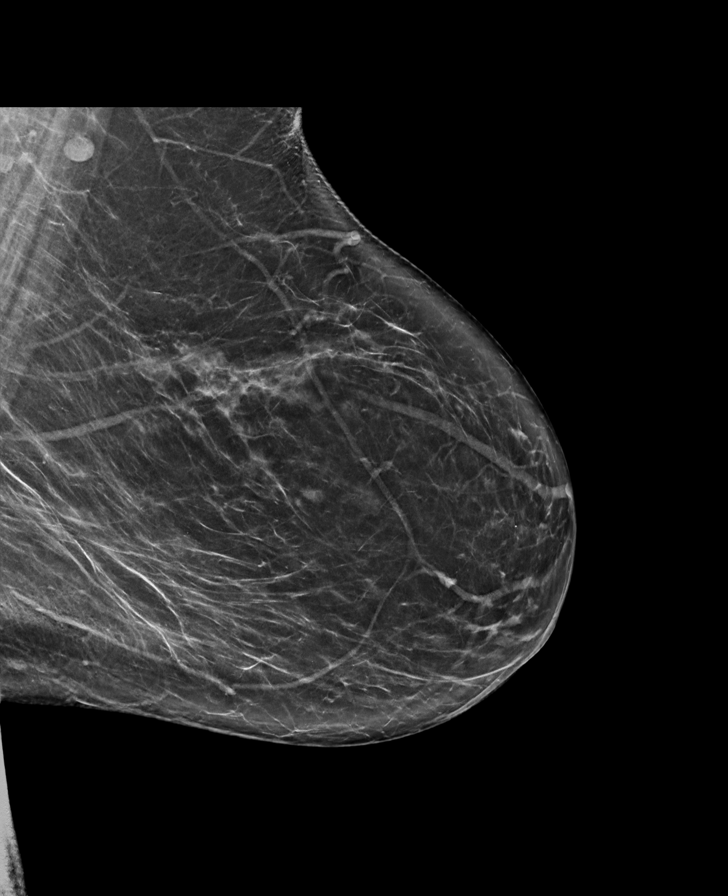

[L CC synth-2D]
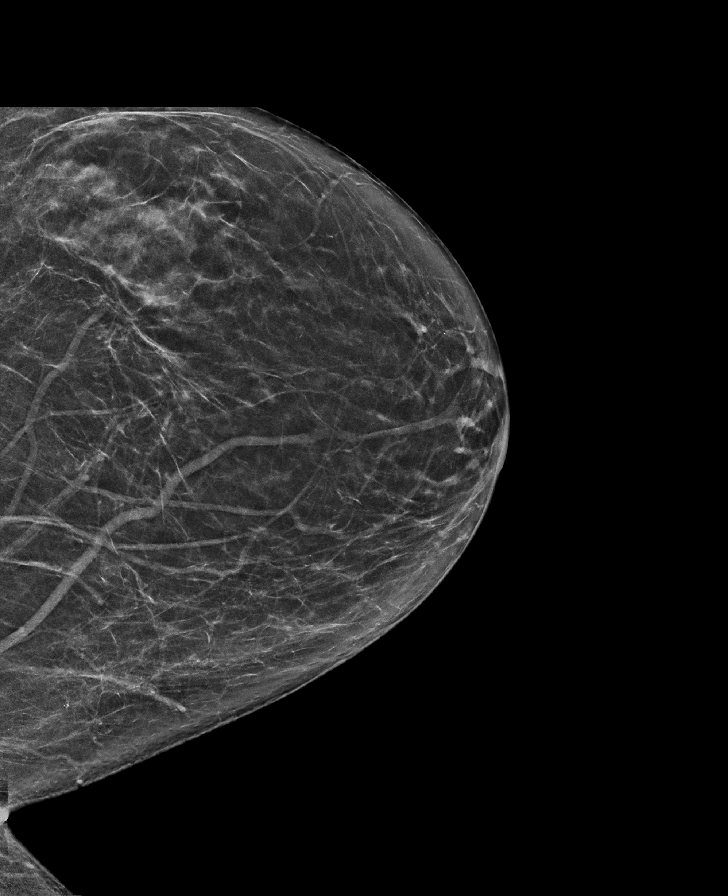

[L MLO tomo · tomo slice 41/82.0]
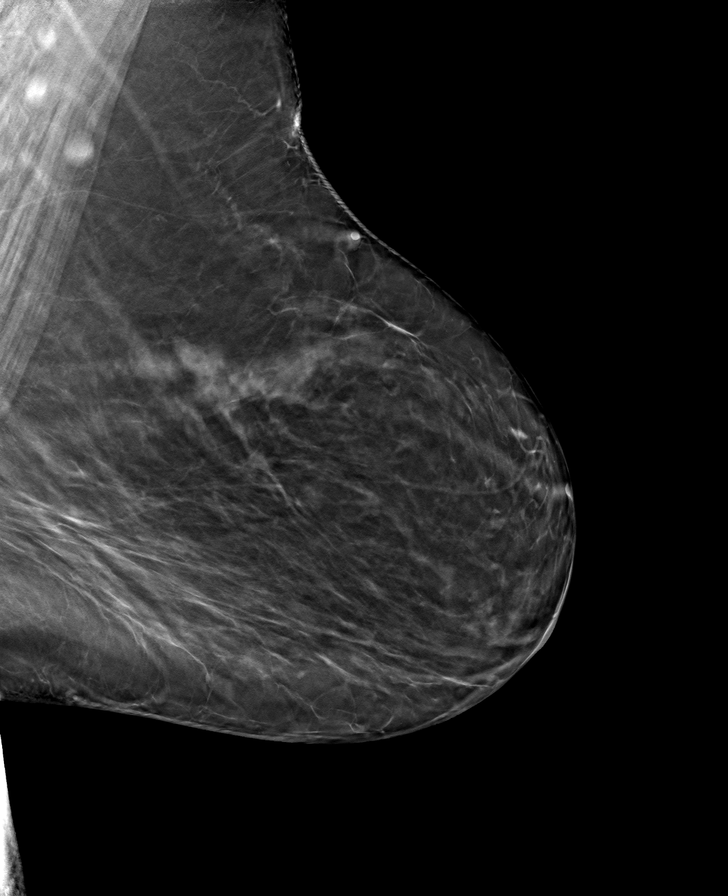

[R MLO tomo · tomo slice 41/82.0]
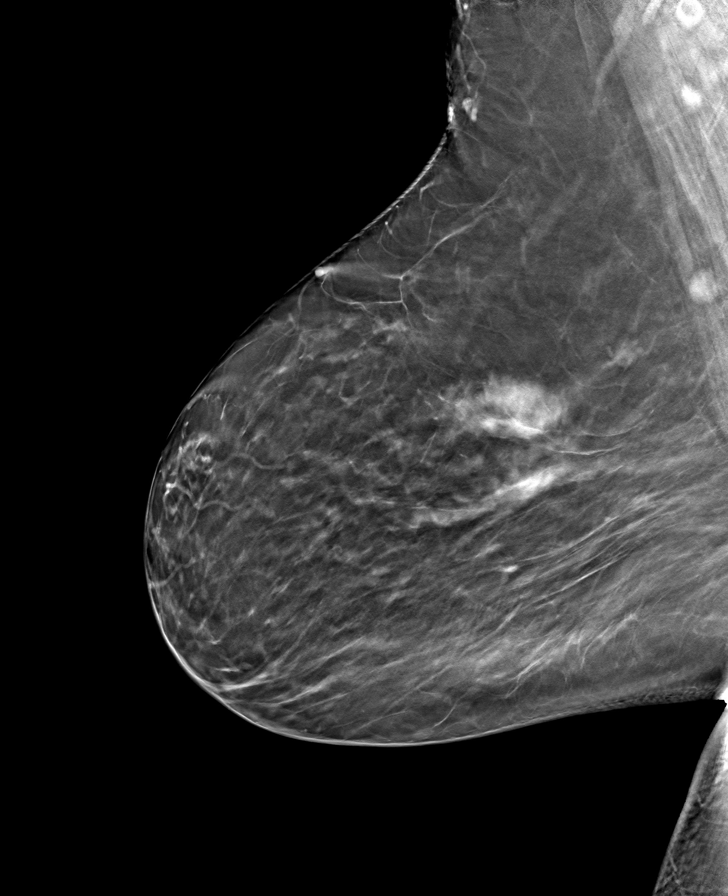

[R CC tomo · tomo slice 34/67.0]
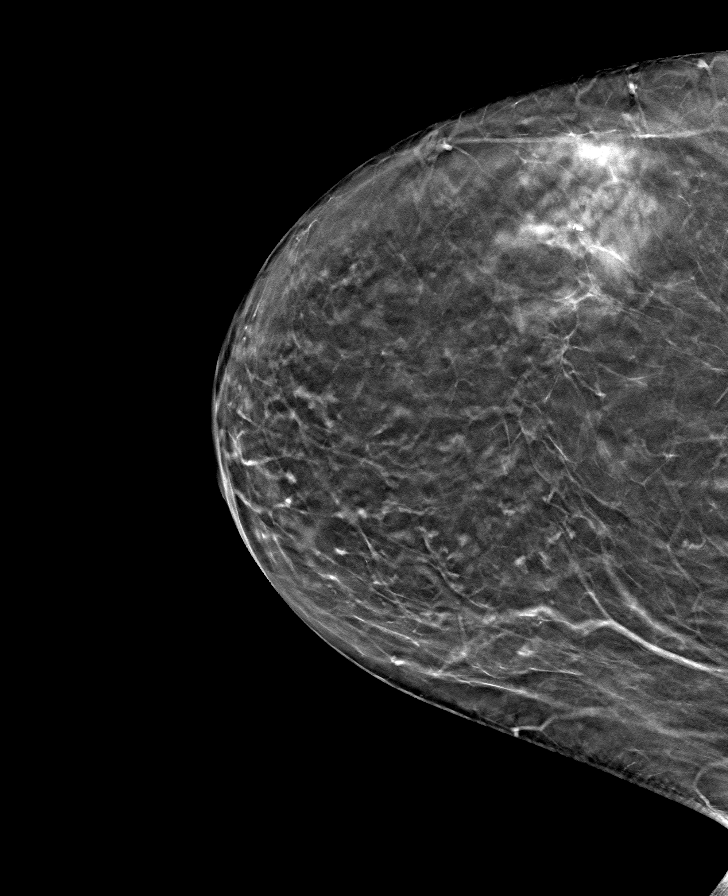

[L CC tomo · tomo slice 35/68.0]
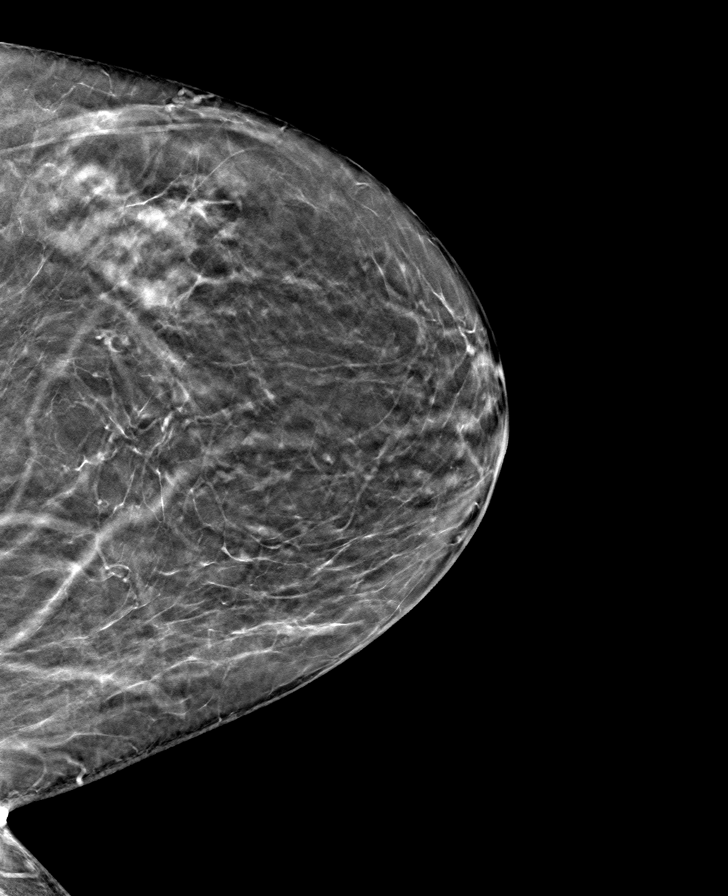

[8 of 24 positions shown; findings below may reference images not displayed]

ACR Breast Density Category b: There are scattered areas of
fibroglandular density.
FINDINGS: There are no findings suspicious for malignancy.
IMPRESSION: No mammographic evidence of malignancy. A result letter of this
screening mammogram will be mailed directly to the patient.

RECOMMENDATION:
Screening mammogram in one year. (Code:51-O-LD2)

BI-RADS CATEGORY  1: Negative.

## 2023-02-20 ENCOUNTER — Other Ambulatory Visit: Payer: Self-pay | Admitting: Internal Medicine

## 2023-02-20 DIAGNOSIS — Z1231 Encounter for screening mammogram for malignant neoplasm of breast: Secondary | ICD-10-CM

## 2023-03-23 DIAGNOSIS — Z1231 Encounter for screening mammogram for malignant neoplasm of breast: Secondary | ICD-10-CM
